# Patient Record
Sex: Male | Born: 1940 | Race: White | Hispanic: No | Marital: Married
Health system: Southern US, Community
[De-identification: ages and names within clinical notes are randomized; demographics above are authoritative.]

---

## 2003-11-10 ENCOUNTER — Inpatient Hospital Stay: Payer: Self-pay | Admitting: Cardiology

## 2003-11-10 ENCOUNTER — Other Ambulatory Visit: Payer: Self-pay

## 2003-11-11 ENCOUNTER — Other Ambulatory Visit: Payer: Self-pay

## 2003-11-13 ENCOUNTER — Ambulatory Visit: Payer: Self-pay | Admitting: Cardiology

## 2004-03-30 ENCOUNTER — Ambulatory Visit: Payer: Self-pay

## 2006-04-28 ENCOUNTER — Other Ambulatory Visit: Payer: Self-pay

## 2006-04-28 ENCOUNTER — Emergency Department: Payer: Self-pay | Admitting: Emergency Medicine

## 2006-09-26 ENCOUNTER — Other Ambulatory Visit: Payer: Self-pay

## 2006-09-26 ENCOUNTER — Inpatient Hospital Stay: Payer: Self-pay | Admitting: Internal Medicine

## 2007-07-02 ENCOUNTER — Emergency Department: Payer: Self-pay | Admitting: Emergency Medicine

## 2007-07-03 ENCOUNTER — Emergency Department: Payer: Self-pay | Admitting: Emergency Medicine

## 2008-02-29 ENCOUNTER — Ambulatory Visit: Payer: Self-pay | Admitting: Unknown Physician Specialty

## 2008-03-17 ENCOUNTER — Ambulatory Visit: Payer: Self-pay | Admitting: Unknown Physician Specialty

## 2008-10-15 ENCOUNTER — Ambulatory Visit: Payer: Self-pay | Admitting: Pain Medicine

## 2008-10-28 ENCOUNTER — Ambulatory Visit: Payer: Self-pay | Admitting: Pain Medicine

## 2008-11-11 ENCOUNTER — Ambulatory Visit: Payer: Self-pay | Admitting: Physician Assistant

## 2008-11-19 ENCOUNTER — Ambulatory Visit: Payer: Self-pay | Admitting: Unknown Physician Specialty

## 2008-11-21 ENCOUNTER — Ambulatory Visit: Payer: Self-pay | Admitting: Unknown Physician Specialty

## 2008-11-25 ENCOUNTER — Ambulatory Visit: Payer: Self-pay | Admitting: Unknown Physician Specialty

## 2009-09-01 ENCOUNTER — Ambulatory Visit: Payer: Self-pay | Admitting: Cardiology

## 2009-10-07 ENCOUNTER — Ambulatory Visit: Payer: Self-pay | Admitting: Otolaryngology

## 2010-10-27 ENCOUNTER — Ambulatory Visit: Payer: Self-pay | Admitting: Otolaryngology

## 2010-11-01 ENCOUNTER — Ambulatory Visit: Payer: Self-pay | Admitting: Otolaryngology

## 2010-11-12 LAB — PATHOLOGY REPORT

## 2010-11-15 ENCOUNTER — Ambulatory Visit: Payer: Self-pay | Admitting: Internal Medicine

## 2010-12-02 ENCOUNTER — Ambulatory Visit: Payer: Self-pay | Admitting: Internal Medicine

## 2010-12-09 ENCOUNTER — Ambulatory Visit: Payer: Self-pay | Admitting: Internal Medicine

## 2011-01-08 ENCOUNTER — Ambulatory Visit: Payer: Self-pay | Admitting: Internal Medicine

## 2011-02-08 ENCOUNTER — Ambulatory Visit: Payer: Self-pay | Admitting: Internal Medicine

## 2011-02-11 LAB — COMPREHENSIVE METABOLIC PANEL
Albumin: 3.7 g/dL (ref 3.4–5.0)
Alkaline Phosphatase: 159 U/L — ABNORMAL HIGH (ref 50–136)
Bilirubin,Total: 0.7 mg/dL (ref 0.2–1.0)
Calcium, Total: 9.3 mg/dL (ref 8.5–10.1)
EGFR (Non-African Amer.): >60
SGOT(AST): 31 U/L (ref 15–37)
SGPT (ALT): 29 U/L

## 2011-02-11 LAB — CBC CANCER CENTER
Basophil %: 0.3 %
Eosinophil #: 0.2 x10 3/mm (ref 0.0–0.7)
HCT: 35.8 % — ABNORMAL LOW (ref 40.0–52.0)
HGB: 12.4 g/dL — ABNORMAL LOW (ref 13.0–18.0)
Lymphocyte %: 9 %
MCH: 32.3 pg (ref 26.0–34.0)
MCHC: 34.6 g/dL (ref 32.0–36.0)
Monocyte #: 0.6 x10 3/mm (ref 0.0–0.7)
Monocyte %: 10.7 %
Neutrophil #: 4.3 x10 3/mm (ref 1.4–6.5)
RDW: 15 % — ABNORMAL HIGH (ref 11.5–14.5)

## 2011-02-11 LAB — LACTATE DEHYDROGENASE: LDH: 252 U/L — ABNORMAL HIGH (ref 87–241)

## 2011-03-11 ENCOUNTER — Ambulatory Visit: Payer: Self-pay | Admitting: Internal Medicine

## 2011-04-13 ENCOUNTER — Ambulatory Visit: Payer: Self-pay | Admitting: Specialist

## 2011-05-20 ENCOUNTER — Ambulatory Visit: Payer: Self-pay | Admitting: Internal Medicine

## 2011-05-20 LAB — CBC CANCER CENTER
Basophil #: 0 x10 3/mm (ref 0.0–0.1)
Basophil %: 0.5 %
Eosinophil #: 0.1 x10 3/mm (ref 0.0–0.7)
Eosinophil %: 1.2 %
HCT: 37.6 % — ABNORMAL LOW (ref 40.0–52.0)
HGB: 12.8 g/dL — ABNORMAL LOW (ref 13.0–18.0)
Lymphocyte #: 1.3 x10 3/mm (ref 1.0–3.6)
Lymphocyte %: 23.1 %
MCH: 32.4 pg (ref 26.0–34.0)
MCHC: 34.1 g/dL (ref 32.0–36.0)
MCV: 95 fL (ref 80–100)
Monocyte #: 0.5 x10 3/mm (ref 0.2–1.0)
Neutrophil %: 65.9 %
Platelet: 178 x10 3/mm (ref 150–440)
RBC: 3.97 10*6/uL — ABNORMAL LOW (ref 4.40–5.90)
WBC: 5.4 x10 3/mm (ref 3.8–10.6)

## 2011-05-20 LAB — HEPATIC FUNCTION PANEL A (ARMC)
Alkaline Phosphatase: 82 U/L (ref 50–136)
Bilirubin, Direct: 0.2 mg/dL (ref 0.00–0.20)
SGOT(AST): 14 U/L — ABNORMAL LOW (ref 15–37)
SGPT (ALT): 20 U/L
Total Protein: 7.2 g/dL (ref 6.4–8.2)

## 2011-05-20 LAB — LACTATE DEHYDROGENASE: LDH: 214 U/L (ref 87–241)

## 2011-05-20 LAB — CREATININE, SERUM: Creatinine: 0.87 mg/dL (ref 0.60–1.30)

## 2011-06-08 ENCOUNTER — Ambulatory Visit: Payer: Self-pay | Admitting: Internal Medicine

## 2011-07-05 ENCOUNTER — Inpatient Hospital Stay: Payer: Self-pay | Admitting: Internal Medicine

## 2011-07-05 LAB — COMPREHENSIVE METABOLIC PANEL
Alkaline Phosphatase: 75 U/L (ref 50–136)
Anion Gap: 11 (ref 7–16)
Bilirubin,Total: 1.3 mg/dL — ABNORMAL HIGH (ref 0.2–1.0)
Calcium, Total: 8.2 mg/dL — ABNORMAL LOW (ref 8.5–10.1)
Chloride: 99 mmol/L (ref 98–107)
EGFR (African American): >60
EGFR (Non-African Amer.): >60
Glucose: 126 mg/dL — ABNORMAL HIGH (ref 65–99)
SGOT(AST): 23 U/L (ref 15–37)
SGPT (ALT): 10 U/L — ABNORMAL LOW
Total Protein: 6.5 g/dL (ref 6.4–8.2)

## 2011-07-05 LAB — URINALYSIS, COMPLETE
Bacteria: NONE SEEN
Bilirubin,UR: NEGATIVE
Blood: NEGATIVE
Glucose,UR: NEGATIVE mg/dL (ref 0–75)
Protein: 30
RBC,UR: 1 /HPF (ref 0–5)
Squamous Epithelial: NONE SEEN
WBC UR: 1 /HPF (ref 0–5)

## 2011-07-05 LAB — CBC
MCHC: 35.8 g/dL (ref 32.0–36.0)
MCV: 93 fL (ref 80–100)
Platelet: 154 10*3/uL (ref 150–440)
RBC: 3.12 10*6/uL — ABNORMAL LOW (ref 4.40–5.90)
RDW: 13.8 % (ref 11.5–14.5)
WBC: 7.7 10*3/uL (ref 3.8–10.6)

## 2011-07-05 LAB — TROPONIN I
Troponin-I: <0.02 ng/mL
Troponin-I: <0.02 ng/mL

## 2011-07-05 LAB — CK TOTAL AND CKMB (NOT AT ARMC)
CK, Total: 20 U/L — ABNORMAL LOW (ref 35–232)
CK, Total: 21 U/L — ABNORMAL LOW (ref 35–232)
CK-MB: <0.5 ng/mL — ABNORMAL LOW (ref 0.5–3.6)

## 2011-07-05 LAB — PRO B NATRIURETIC PEPTIDE: B-Type Natriuretic Peptide: 837 pg/mL — ABNORMAL HIGH (ref 0–125)

## 2011-07-06 LAB — CBC WITH DIFFERENTIAL/PLATELET
Basophil #: 0 10*3/uL (ref 0.0–0.1)
Eosinophil #: 0 10*3/uL (ref 0.0–0.7)
Eosinophil %: 0 %
HGB: 10.4 g/dL — ABNORMAL LOW (ref 13.0–18.0)
Lymphocyte %: 8.1 %
MCH: 32.9 pg (ref 26.0–34.0)
MCHC: 35.1 g/dL (ref 32.0–36.0)
Monocyte %: 3.1 %
Neutrophil #: 5.1 10*3/uL (ref 1.4–6.5)
Neutrophil %: 88.6 %
RBC: 3.17 10*6/uL — ABNORMAL LOW (ref 4.40–5.90)
RDW: 13.7 % (ref 11.5–14.5)
WBC: 5.8 10*3/uL (ref 3.8–10.6)

## 2011-07-06 LAB — BASIC METABOLIC PANEL
Anion Gap: 9 (ref 7–16)
BUN: 11 mg/dL (ref 7–18)
Calcium, Total: 8.9 mg/dL (ref 8.5–10.1)
Co2: 26 mmol/L (ref 21–32)
EGFR (African American): >60
EGFR (Non-African Amer.): >60
Osmolality: 277 (ref 275–301)
Potassium: 3.9 mmol/L (ref 3.5–5.1)

## 2011-07-06 LAB — IRON AND TIBC
Iron Bind.Cap.(Total): 191 ug/dL — ABNORMAL LOW (ref 250–450)
Iron: 27 ug/dL — ABNORMAL LOW (ref 65–175)
Unbound Iron-Bind.Cap.: 164 ug/dL

## 2011-07-06 LAB — TROPONIN I: Troponin-I: <0.02 ng/mL

## 2011-07-06 LAB — PRO B NATRIURETIC PEPTIDE: B-Type Natriuretic Peptide: 1040 pg/mL — ABNORMAL HIGH (ref 0–125)

## 2011-07-07 LAB — CBC WITH DIFFERENTIAL/PLATELET
Basophil #: 0 10*3/uL (ref 0.0–0.1)
Eosinophil #: 0 10*3/uL (ref 0.0–0.7)
Eosinophil %: 0 %
HGB: 10 g/dL — ABNORMAL LOW (ref 13.0–18.0)
MCH: 31.9 pg (ref 26.0–34.0)
MCHC: 34.3 g/dL (ref 32.0–36.0)
MCV: 93 fL (ref 80–100)
Monocyte #: 0.6 x10 3/mm (ref 0.2–1.0)
Neutrophil #: 10.3 10*3/uL — ABNORMAL HIGH (ref 1.4–6.5)
Platelet: 213 10*3/uL (ref 150–440)
RBC: 3.13 10*6/uL — ABNORMAL LOW (ref 4.40–5.90)
WBC: 11.4 10*3/uL — ABNORMAL HIGH (ref 3.8–10.6)

## 2011-07-07 LAB — BASIC METABOLIC PANEL
Anion Gap: 11 (ref 7–16)
BUN: 14 mg/dL (ref 7–18)
Chloride: 102 mmol/L (ref 98–107)
Co2: 25 mmol/L (ref 21–32)
Creatinine: 0.6 mg/dL (ref 0.60–1.30)
EGFR (African American): >60
Osmolality: 280 (ref 275–301)

## 2011-07-09 ENCOUNTER — Ambulatory Visit: Payer: Self-pay | Admitting: Internal Medicine

## 2011-08-08 DEATH — deceased

## 2013-12-14 IMAGING — RF DG CHEST FLUORO
1 series · 11 of 11 positions shown · non-contrast
Comparison: none

REASON FOR EXAM: SNIFF   RT elevated diaphragm
COMMENTS:

PROCEDURE:     FL  - FL CHEST FLUORO W/ SPOT FILMS  - April 13, 2011  [DATE]
RESULT:

[Series 1: run · 6 acquisitions, 11 frames shown]
[im 1/6]
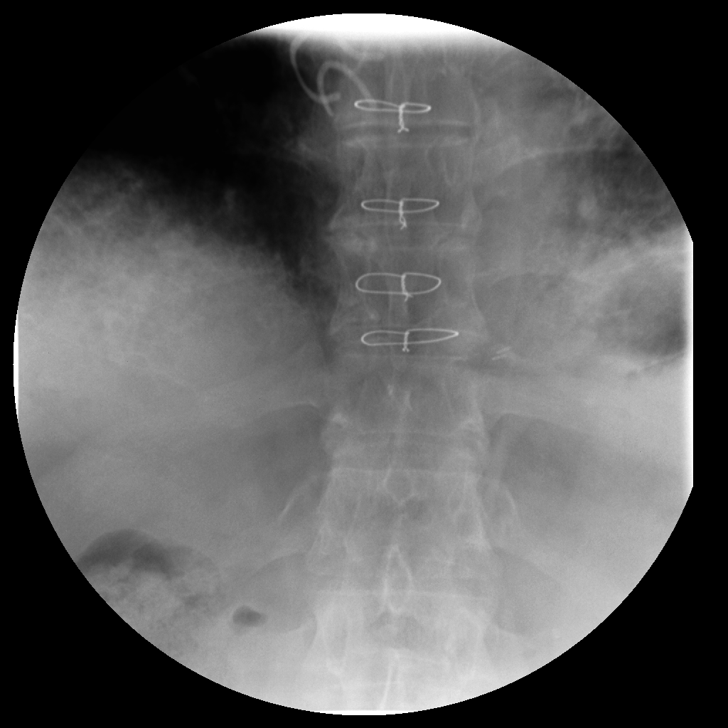
[im 2/6]
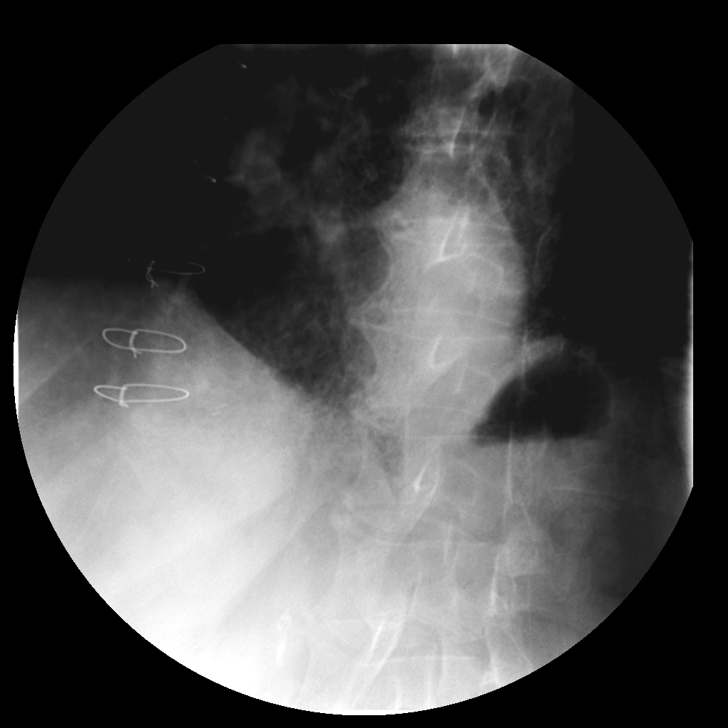
[im 3/6]
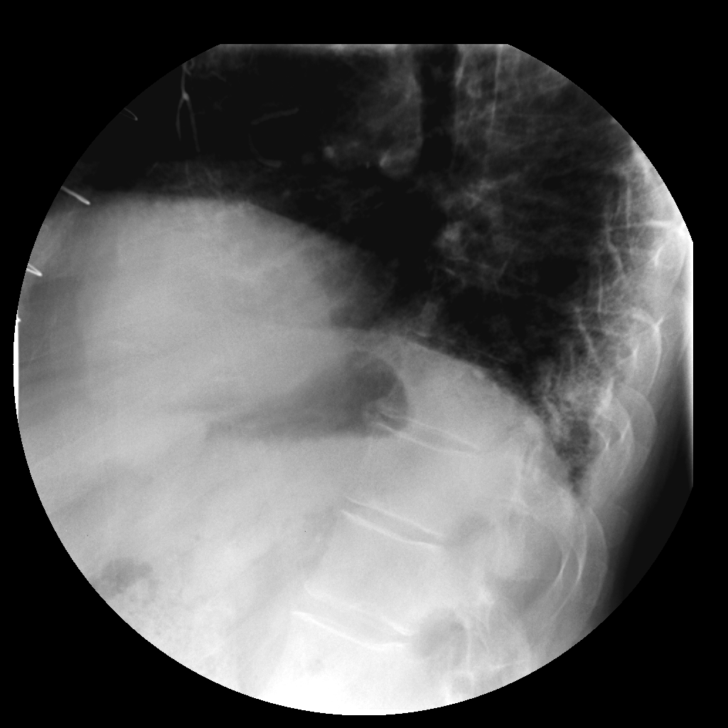
[im 4/6]
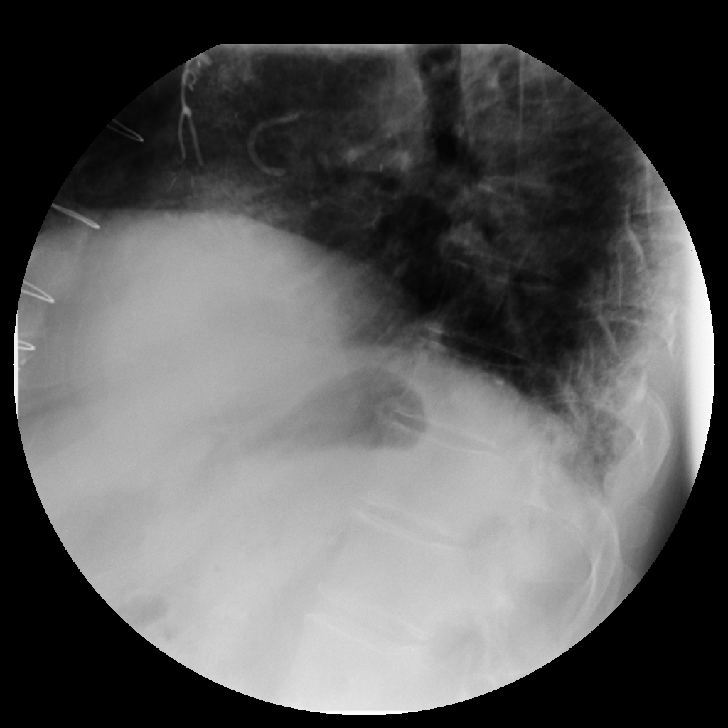
[im 4/6]
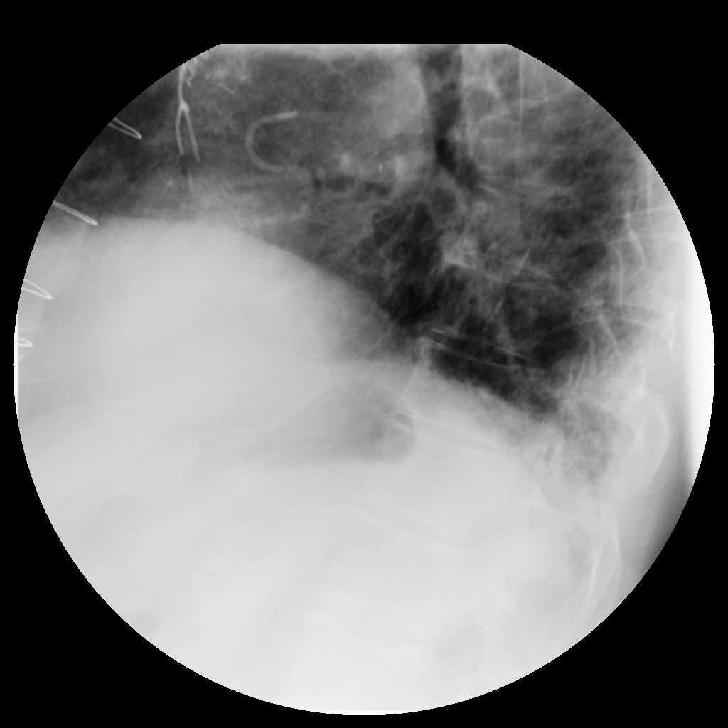
[im 4/6]
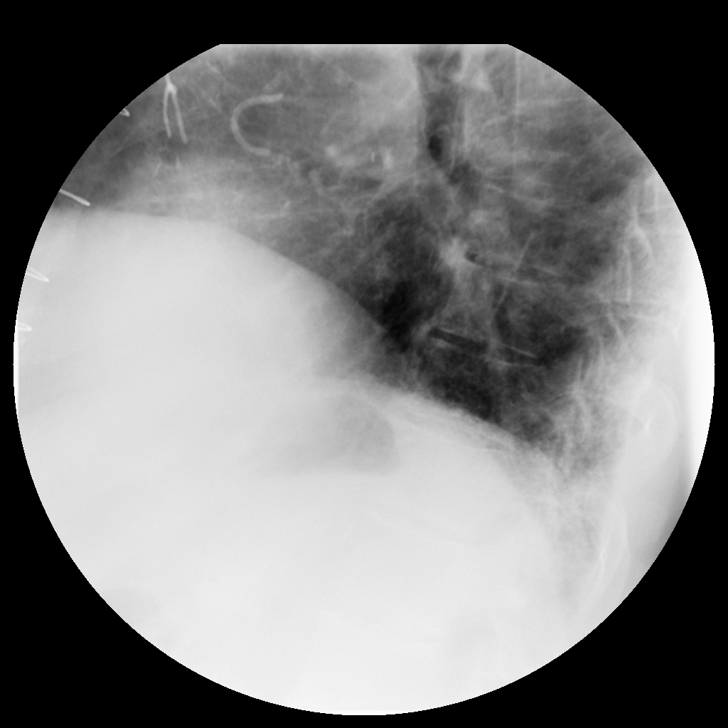
[im 4/6]
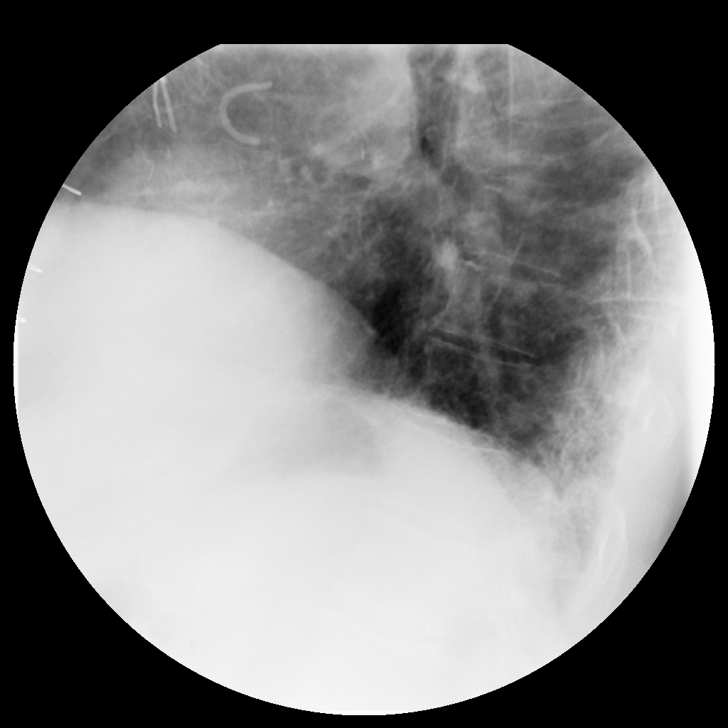
[im 5/6]
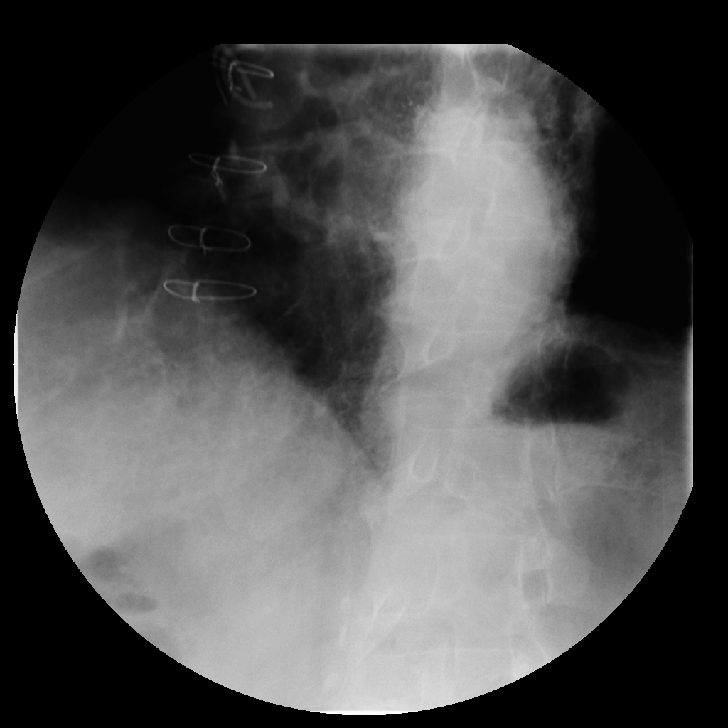
[im 5/6]
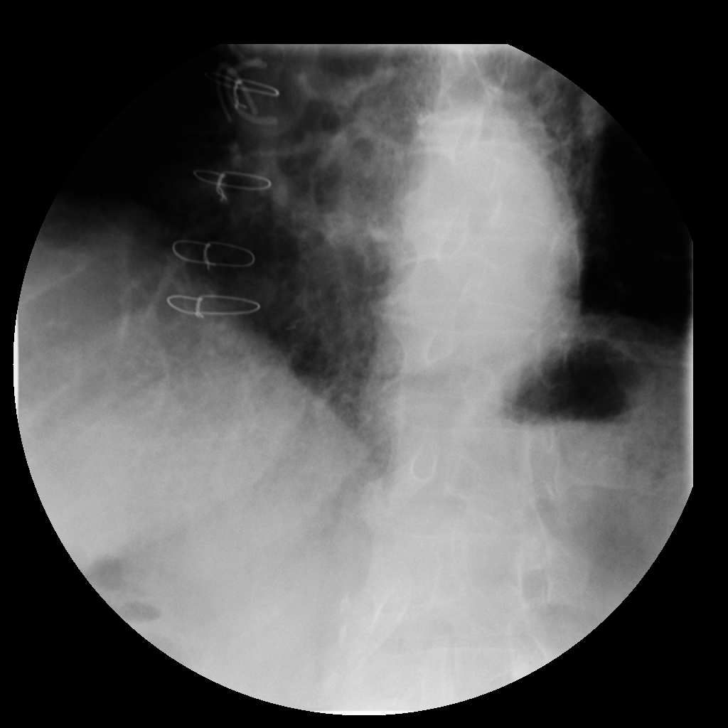
[im 6/6]
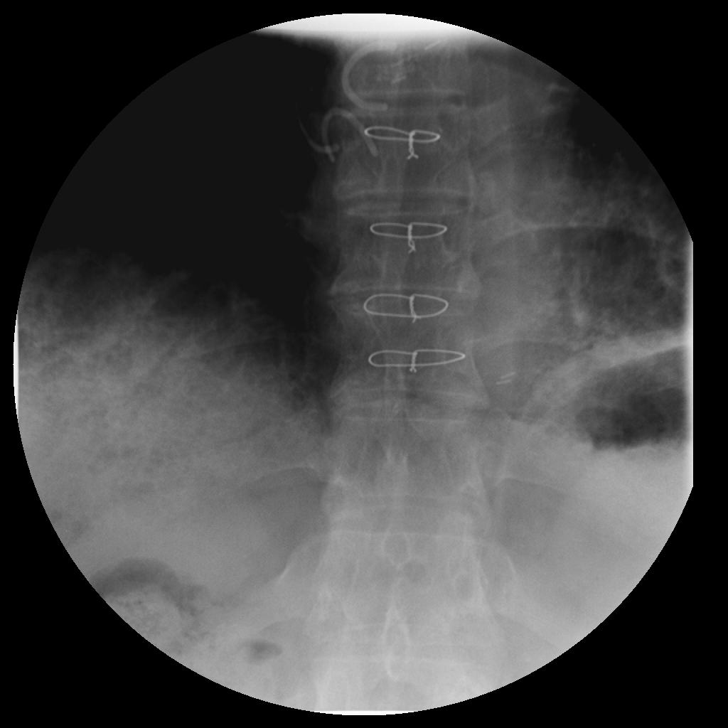
[im 6/6]
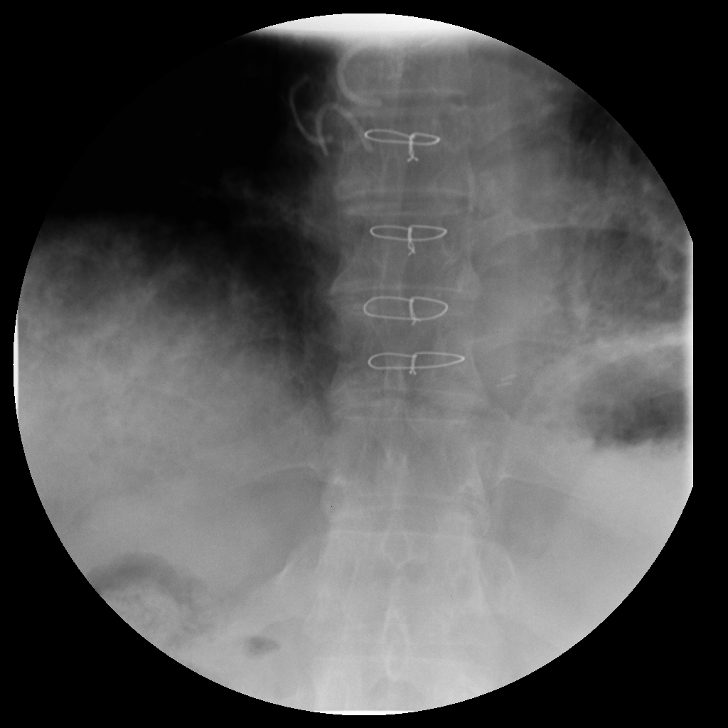

[11 of 11 positions shown; findings below may reference images not displayed]

FINDINGS: Fluoroscopic evaluation of the right and left hemidiaphragms were
evaluated during sniffing. There is mild excursion of the elevated right
hemidiaphragm without evidence of paradoxical movement. The left
hemidiaphragm demonstrates appropriate excursion during sniffing.
IMPRESSION: 1. Elevated right hemidiaphragm without evidence of paralysis. There is
appropriate excursion though decreased when compared to the left.

## 2014-01-26 IMAGING — CT CT NECK-CHEST-ABD-PELV W/ CM
1 of 4 series · 4 of 14 positions shown, 5 images · non-contrast
Comparison: none

REASON FOR EXAM: Restaging Lymphoma Allergic to IV Dye Has Prep
COMMENTS:

PROCEDURE:     CT  - CT NECK CHEST ABD/PEL W  - May 26, 2011  [DATE]
RESULT:
TECHNIQUE: CT of the neck, chest, abdomen and pelvis is reconstructed at
mm slice thickness in the axial plane.
Comparison is made to the previous exam dated 19 November, 2010.

[Series 4: soft tissue · axial · 0.82mm/px · z∈[-378,+27]mm · 4 of 136 slices shown, 5 images]
[im 28/136  soft-tissue]
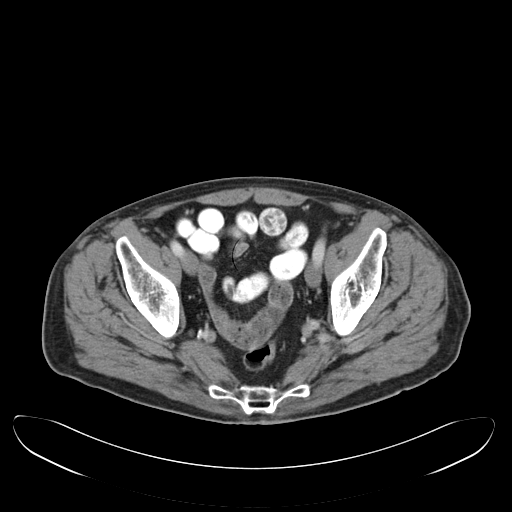
[im 28/136  bone]
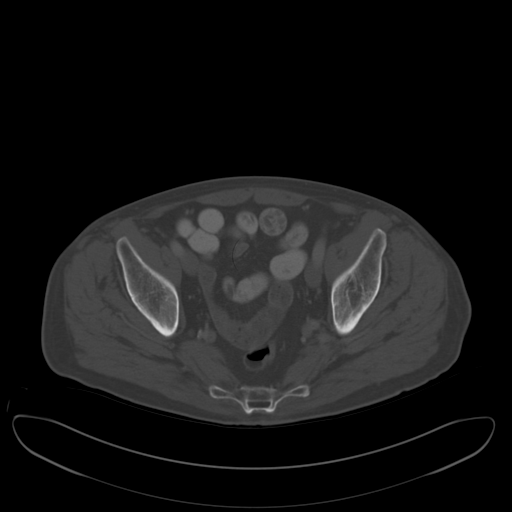
[im 55/136  soft-tissue]
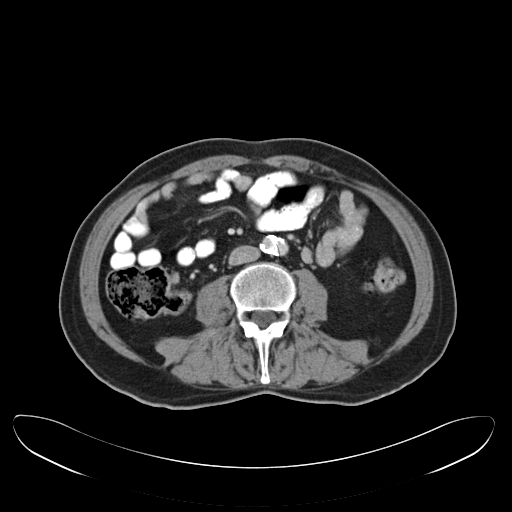
[im 82/136  soft-tissue]
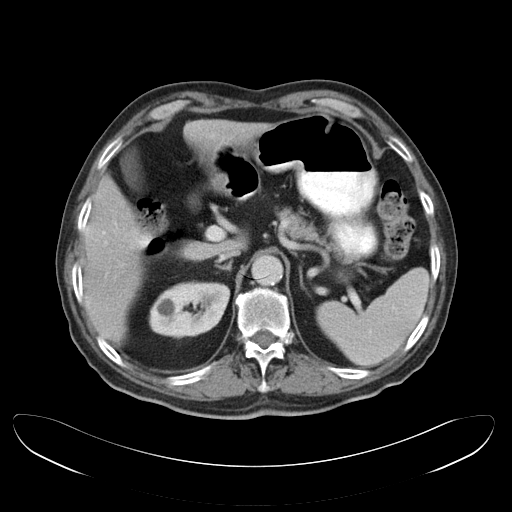
[im 109/136  soft-tissue]
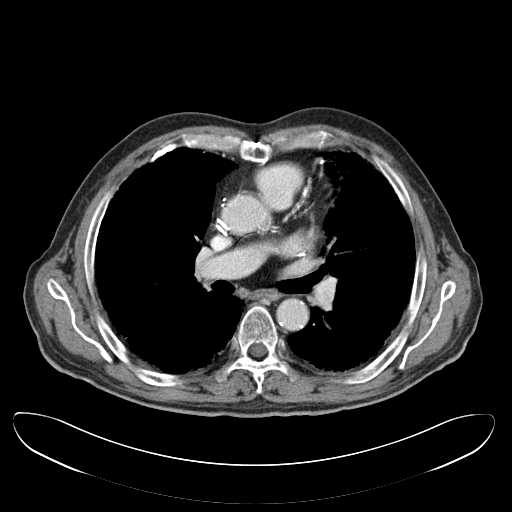

[4 of 14 positions shown; findings below may reference images not displayed]

FINDINGS: There is some artifact on many of the images slightly degrading
the study. The base of the brain is unremarkable. The sinuses and mastoid
air cells show normal aeration. The salivary glands are unremarkable. No
cervical mass or adenopathy is evident. The thyroid lobes enhance normally.
The patient has some residual small AP window, para-aortic and precarinal
lymph nodes. Short axis measurement of the precarinal node is 6.8 mm on
image #22. Small, subcarinal lymph node is present. No hilar mass or
adenopathy is evident. The heart is enlarged. There are calcified pleural
plaques as noted previously consistent with previous asbestos exposure and
asbestos related pleural disease. Pulmonary fibrotic changes with septal
thickening are present. Underlying asbestosis is a strong likelihood.
Pulmonology follow-up is recommended. No new or evolving mass is apparent
within the lungs.

The liver is normal in size and enhancement. The spleen is not enlarged. No
retroperitoneal mass or adenopathy is evident. No pelvic or inguinal
adenopathy is seen. No mesenteric adenopathy is present. No abnormal bowel
distention or bowel wall thickening is demonstrated. Minimal density is seen
in the gallbladder in the dependent region on image #52 suggestive of
cholelithiasis. The adrenal glands are normal. There is a cyst in the upper
pole of the right kidney measuring up to 1.9 cm diameter. No solid renal
mass or renal enlargement is evident. Atherosclerotic calcification is
present within the abdominal aorta extending into the iliac systems. There
is no aneurysm. The pancreas appears unremarkable. The urinary bladder is
nondistended. The wall is mildly prominent which could reflect under
distention, neurogenic bladder or cystitis.
IMPRESSION: 1.  Asbestos related pleural disease and probable asbestosis.
2.  Decreased size of the mediastinal lymph nodes. No areas of adenopathy
are present by size criteria. Follow-up PET may be beneficial if there is
concern for persistent or recurrent disease.
3.  Atherosclerotic disease.
4.  CABG changes are present.
5.  Renal cyst.
6.  Possible cholelithiasis.
7.  The bladder is nondistended.

## 2014-06-01 NOTE — H&P (Signed)
PATIENT NAME:  Ivan Mason, Ivan Mason MR#:  [id number] DATE OF BIRTH:  November 27, 1940  DATE OF ADMISSION:  07/05/2011  PRIMARY CARE PHYSICIAN:  Dr. Candelaria Stagers. CARDIOLOGIST:  Dr. Harold Hedge. PULMONOLOGIST: Dr. Meredeth Ide.   CHIEF COMPLAINT: Chest pain and shortness of breath.   HISTORY OF PRESENT ILLNESS: This is a 74 year old male who presents to the Emergency Room due to acute onset of chest pain and shortness of breath that began earlier last night. The patient says that he woke up around 4:00 in the morning when he was having chest pain in the center of his chest. He went to the bathroom and then felt a bit dizzy and lightheaded and he sat down and his dizziness went away. He took some sublingual nitroglycerin, but the chest pain did not go away and then when he tried to get up again, he became even more dizzy and became more short of breath. EMS was called and he was brought to the hospital. Upon arrival of EMS, he was noted to be significantly hypoxic with 02 sats in the mid 70s. Presently, he is on a nonrebreather and using some accessory muscles. Due to his profound chest pain and worsening hypoxia hospitalist services were contacted for further treatment and evaluation. The patient does admit to a cough, but it is nonproductive. It has been going on for many months. He denies any nausea, vomiting, abdominal pain, constipation, diarrhea, any weight loss, hemoptysis, or any other associated symptoms.   REVIEW OF SYSTEMS: CONSTITUTIONAL: No documented fever, weight loss about 20 to 30 pounds since September of last year since he was diagnosed with lymphoma. EYES: No blurred or double vision. ENT: No tinnitus or postnasal drip. No redness of the oropharynx. RESPIRATORY: Positive cough, chronic. No wheeze, no hemoptysis. Positive dyspnea. CARDIOVASCULAR: Positive chest pain, no orthopnea, no palpitations, or syncope. GASTROINTESTINAL: No nausea. No vomiting. No diarrhea. No abdominal pain, no melena, no  hematochezia. GU: No dysuria or hematuria. ENDOCRINE: No polyuria or nocturia. No heat or cold intolerance. HEMATOLOGIC: No anemia, no bruising, no bleeding. INTEGUMENTARY: No rashes. No lesions. MUSCULOSKELETAL: No arthritis, no swelling or gout. NEUROLOGIC: No numbness, no tingling, no ataxia, no seizure-type activity. PSYCH: No anxiety, no insomnia, no ADD.   PAST MEDICAL HISTORY:  1. Coronary artery disease, status post coronary artery bypass graft and stent placement. 2. History of B cell lymphoma, currently in remission.  3. 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  4. Hypertension.  5. Hyperlipidemia.  6. Anxiety.   ALLERGIES: IV dye to which he gets hives.   SOCIAL HISTORY: No smoking, occasional alcohol use. No illicit drug abuse. Lives at home with his wife.   FAMILY HISTORY: The patient's mother died from complications of myocardial infarction and stroke. Father died from multiple transient ischemic attacks, but he does not know the acute source of his death.   CURRENT MEDICATIONS:  1. Metoprolol tartrate 50 mg b.i.d.  2. Ramipril 2.5 mg daily.  3. Amlodipine 5 mg daily.  4. Atorvastatin 40 mg daily.  5. Gemfibrozil 600 mg b.i.d.  6. Xanax 0.25 mg b.i.d. as needed.  7. Ranexa 1000 mg b.i.d.  8. Nitroglycerin patch daily. 9. Plavix 75 mg daily.  10. Sublingual nitroglycerin as needed. 11. Aleve as needed.  12. Aspirin 81 mg daily.  13. Vitamin E 800 international units daily.  14. Vitamin C 1000 mg daily. 15. Prevacid 15 mg daily. 16. Gentamicin ointment apply to the nose three times daily. 17. Hydrocodone with chlorpheniramine suspension  1 tsp q.24 hours.  18. Pilocarpine 5 mg daily.   PHYSICAL EXAMINATION ON ADMISSION:  VITAL SIGNS: Temperature 98.5, pulse 72, respirations 20, blood pressure 115/60, sats 98% on nonrebreather.   GENERAL: He is a pleasant appearing male in mild respiratory distress.   HEENT: He is atraumatic, normocephalic.  Extraocular muscles are intact. Pupils are equal and reactive to light. Sclerae are anicteric. No conjunctival injection. No pharyngeal erythema.   NECK: Supple. No jugular venous distention, no bruits, no lymphadenopathy, no thyromegaly.   HEART: Regular rate and rhythm. No murmurs, rubs, or clicks.   LUNGS: He has diffuse crackles bilaterally. Positive use of accessory muscles. No dullness to percussion.   ABDOMEN: Soft, flat, nontender, nondistended. Has good bowel sounds. No hepatosplenomegaly appreciated.   EXTREMITIES: No evidence of any cyanosis, clubbing, or peripheral edema. Has +2 pedal and radial pulses bilaterally.   NEUROLOGICAL: The patient is alert, awake, and oriented x3 with no focal motor or sensory deficits appreciated bilaterally.   SKIN: Moist and warm with no rash appreciated.   LYMPHATIC: There is no cervical or axillary lymphadenopathy.   LABORATORY, DIAGNOSTIC, AND RADIOLOGICAL DATA: Glucose 126, BUN 13, creatinine 0.6, sodium 134, potassium 3.8, chloride 99, bicarbonate 24. The patient's LFTs are within normal limits. Troponin less than 0.02. White cell count 7.7, hemoglobin 10.4, hematocrit 29.1, platelet count 154. D-dimer is elevated at 3.35. Urinalysis normal. The patient did have an EKG done which showed normal sinus rhythm with normal axis and no ST or T wave changes. Chest x-ray shows findings representing superimposed pulmonary edema on the background of chronic interstitial lung disease. Worsening chronic interstitial lung disease is also a differential consideration.   ASSESSMENT AND PLAN: This is a 74 year old male with history of pulmonary fibrosis secondary to asbestosis exposure, history of lymphoma, hypertension, coronary artery disease, status post coronary artery bypass graft and stent placement, gastroesophageal reflux disease, and hyperlipidemia who presents to the hospital with acute onset of chest pain and shortness of breath.  1. Chest pain. The  patient has multiple risk factors for both pulmonary embolism and underlying coronary artery disease. He has a previous history of coronary artery bypass graft and stent placement. He also has a previous history of malignancy, now with an elevated d-dimer. For now, will place him on telemetry and check serial cardiac enzymes. Continue his aspirin, Plavix, beta blocker, statin and Ranexa and also nitroglycerin. Will get a cardiology consult. The patient likely would also benefit from a CT of the chest to rule out pulmonary embolism. As his d-dimer is elevated, but he is allergic to IV dye, I will premedicate him and try to get a CT chest. Will empirically start him on Lovenox to treat both pulmonary embolus and acute coronary syndrome presently.  2. Shortness of breath/acute respiratory failure. This is likely acute on chronic respiratory failure. The patient has underlying pulmonary fibrosis, but his oxygen requirements have gone up. Therefore, there is a concern for possible underlying pulmonary embolism. As mentioned, I will empirically start him on Lovenox. I will get a CT of the chest with contrast but he needs to be premedicated given his allergy to IV dye. Continue oxygen supplementation and follow ABGs. Will get a pulmonary consult. The patient is followed by Dr. Meredeth Ide.  3. History of coronary artery disease, status post coronary artery bypass graft. The patient is currently having chest pain. His first set of cardiac markers are negative. I will place him on telemetry and follow serial markers. Continue aspirin,  Plavix, beta blocker, statin and Ranexa. Will get a cardiology consult. The patient is known to Dr. Lady GaryFath.  4. Gastroesophageal reflux disease. Continue with omeprazole.  5. Hyperlipidemia. Continue with atorvastatin and gemfibrozil.  6. History of lymphoma. The patient is followed by Dr. Sherrlyn HockPandit, but the cancer is currently in remission. He had a recent CT of chest, abdomen and pelvis with  contrast in April of this past year which showed no evidence of any lymphadenopathy.  7. Hypertension, presently hemodynamically stable. Continue metoprolol, Norvasc and Ramipril.  8. Anxiety. Continue with p.r.n. Xanax.   CODE STATUS: The patient is a FULL CODE.   The patient will be transferred to Dr. Fransico Settershaplin's service by tomorrow.       Critical Care TIME SPENT WITH ADMISSION: 50 minutes.  ____________________________ Rolly PancakeVivek J. Cherlynn KaiserSainani, MD vjs:ap D: 07/05/2011 09:01:18 ET T: 07/05/2011 09:42:14 ET JOB#: [id number]  cc: Rolly PancakeVivek J. Cherlynn KaiserSainani, MD, <Dictator> Jimmie Mollyon C. Candelaria Stagershaplin, MD Houston SirenVIVEK J SAINANI MD ELECTRONICALLY SIGNED 07/08/2011 16:16

## 2014-06-01 NOTE — Consult Note (Signed)
PATIENT NAME:  Ivan Mason, Ivan Mason MR#:  [id number] DATE OF BIRTH:  1940/02/21  DATE OF CONSULTATION:  07/05/2011  REFERRING PHYSICIAN:   CONSULTING PHYSICIAN:  Ivan E. Meredeth Ide, MD  PRIMARY CARE PHYSICIAN: Dr. Conchita Mason    CHIEF COMPLAINT: Shortness of breath, hypoxia, pulmonary fibrosis.   HISTORY OF PRESENT ILLNESS: [name] is a 74 year old gentleman whom I know quite well from [facility] who was also being followed by Dr. Candelaria Mason as well as Dr. Sherrlyn Mason the latter of which he is being followed for lymphoma, status post chemotherapy and radiation. He presented to pulmonary because of increasing shortness of breath and subsequently we thought it was due primarily to pulmonary fibrosis, which was there prior to radiation and we thought radiation may have added some more insult. He also had an elevated right diaphragm, which thought could be contributing if it was paralyzed hence went on to a sniff test which showed that the diaphragm was not paralyzed. His chest CT prior to this one has been showing significant resolution of his lymphadenopathy or his lymphoma and after being placed on prednisone as well as albuterol and Dulera his breathing had improved; in fact when he was here on 06/22/2011 after resting exercise oxygen sat at peak exercise his sats remained greater than 97% on room air.   Unfortunately since last seeing [place] and attempting to decrease his prednisone Ivan Mason voiced that he was noticing that he was becoming more and more short of breath, also noted that his exercise tolerance was decreasing. At times when he got up his sats would drop down in the low 70s, would get dizzy at times but did not pass out. This progressed to the point that he could not take it, hence called EMS to come and see him. At time when he came he was noted to be hypoxic and hence was brought promptly to the Emergency Room and consequently was admitted. He also mentioned that off and on he thought he  was having bouts of fever, slight cough. There was no associated nausea, vomiting nor was he having any frank chest pain, palpitations, syncope. No gross hematuria. No lymph nodes. No bleeding. No sinus pressure or significant postnasal dripping. No dysphagia or choking spells. At the time that I saw him he was on a nonrebreather with sats 92% to 93%. Wife was not at that time in the room. Discussed the case with Ivan Mason and we thought he would be better served in the Intensive Care Unit presently still wanting everything done including intubation. Chest CT scan was ordered intent to rule out PE.   PAST MEDICAL HISTORY:  1. Coronary artery disease status post open heart surgery as well as stent placement.  2. History of B-cell lymphoma followed by Dr. Sherrlyn Mason.  3. History of pulmonary fibrosis, questionable due to asbestos exposure versus exposure to formaldehyde.  4. Hypertension.  5. Hyperlipidemia.  6. Anxiety.  7. Gastroesophageal reflux disease.    PAST SURGICAL HISTORY:  1. Status post coronary artery bypass surgery. 2. Surgery for Dupuytren's contracture of the hands. 3. Repeat surgery to the hand in 2007. 4. Cardiac stent placement in 2008.  5. Status post right cervical node biopsy and resection. Positive for B-cell lymphoma.   ALLERGIES: IV dye, gets hives.  ADMITTING MEDICATIONS:   1. Metoprolol 50 mg b.i.d.  2. Ramipril 2.5 mg daily.  3. Norvasc 5 mg daily.  4. Atorvastatin 40 mg daily.  5. Gemfibrozil 600 mg b.i.d.  6.  Xanax 0.25 mg b.i.d.  7. Ranexa 1000 mg b.i.d.  8. Nitroglycerin patch daily. 9. Plavix 75 mg daily.  10. Sublingual nitroglycerin p.r.n.  11. Aleve p.r.n.  12. Aspirin 81 mg daily.  13. Vitamin E 800 international units daily. 14. Vitamin C 1000 mg daily. 15. Prevacid 15 mg daily. 16. Gentamicin ointment to nose t.i.d.  17. Pilocarpine 5 mg daily.  18. Hydrocodone with chlorpheniramine suspension 1 tsp q.24 hours. SOCIAL HISTORY: Does not smoke.  Occasionally drink alcoholic beverages. Lives at home with his wife.   FAMILY HISTORY: Patient's mother died from complications of myocardial infarction and stroke. Father died from transient ischemic attack.   REVIEW OF SYSTEMS: Ivan Mason is in bed, able to speak in full sentences. Voices not having headache, dizziness, passing out spells, change in vision or hearing. No sore throat, sinus pressure, nosebleeds nor is he having any neck pain, stiffness or parotid gland enlargement. No pleurisy, hemoptysis. There is increased shortness of breath as already described with activity. No chest pain, palpitations, syncope nausea, vomiting, diarrhea, hematuria, dysuria, flank pain rashes, nonhealing ulcers, dysphagia, gastroesophageal reflux disease, choking spells, rashes, lymph nodes. No new focal neurological symptoms. Weak, however, dizzy at times when he stands up. No suicidal ideation, muscle tenderness, joint effusion. Rest of review was noncontributory.   PHYSICAL EXAMINATION:  VITAL SIGNS: Temperature 97.6, pulse 74, respirations 20, blood pressure 114/62, oxygen sats 99% on nonrebreather.   GENERAL: Pleasant gentleman, quite cooperative sitting in a chair with a nonrebreather on, lean, tall. Mild to moderate respiratory distress, however.  HEENT: Normocephalic, nontraumatic. EOMI, perrla Oropharynx no thrush.   NECK: Supple. No jugular venous distention. No thyromegaly or stridor.   CHEST: Bilateral breath sounds with basilar crackles noted. Much more than when we saw him last in the office.   CARDIAC: Regular rhythm. Mildly tachycardic. No new murmurs or gallops. No peripheral signs spontaneous bacterial endocarditis.   ABDOMEN: Soft, nontender.   EXTREMITIES: No significant edema, cyanosis, Homans signs.   SKIN: Fair turgor. No rashes.   LYMPH: No nodes in neck or supraclavicular area   NEUROLOGICAL: Did not ambulate but able to raise extremities against gravity. Cranial nerves noted  to be unchanged and intact.    PSYCH: Appropriate affect. Somewhat anxious as one would expect.  MUSCULOSKELETAL: No obvious joint effusions. Range of motion intact.   LABORATORY, DIAGNOSTIC AND RADIOLOGICAL DATA: Glucose 142, BUN 11, creatinine 0.55, sodium 138, potassium 3.9, chloride 103. Cardiac enzymes are flat. White count 7.7, hemoglobin 10.4, hematocrit 29.1, platelets 154. D-dimer 3.35. Urinalysis was no blood or pyuria. ABG on admission pH 7.45, pCO2 37, pO2 115 on FiO2 of 0.44 nasal cannula. Repeat gas on nonrebreather showed pH 7.41, pCO2 39, pO2 80, FiO2 1. Chest x-ray findings showed interstitial changes, question is it all pulmonary fibrosis or superimposed pulmonary edema.   IMPRESSION/RECOMMENDATIONS: Mr. Londell MohLeonard Stanger is a pleasant 74 year old gentleman who is known to have B-cell lymphoma followed by Dr. Sherrlyn HockPandit, known pulmonary fibrosis, plus/minus minimum radiation pneumonitis who comes in with worsening shortness of breath, significant hypoxia now on 100% nonrebreather with marginal oxygenation. Looking at his chest x-ray I agree that his pulmonary fibrosis has worsened versus superimposed pulmonary edema or some other inflammatory noninfectious cause.  However, a superimposed infection cannot be ruled out. Seems less likely since he has a normal white count on prednisone. Although he did give a vague history that he may have had some fevers. The other part of the differentials include the possibility of increased  lymphogenic spread from his B-cell lymphoma. Hence, I think we should notify Dr. Sherrlyn Mason about his admission. In the meantime, I agree with covering him with oxygen and sending him to the Intensive Care Unit to ensure that in case he does fail  we will move on with intubation. Will also consider palliative to see him to help clarify some of the issues going forward. Agree with steroids. Cautious diuresis would be reasonable but I would check a BNP, sed rate, ace level in the  morning with his routine labs. Lastly would empirically place him on Zithromax 500 mg daily and Rocephin 1 gram daily. Beta-2 agonist with albuterol q.6 hours via nebulizer treatments. Attempt to wean his oxygen as tolerated. Reassess in the morning. See orders.  ____________________________ Clenton Pare Meredeth Ide, MD hef:cms D: 07/06/2011 08:11:00 ET T: 07/06/2011 11:07:18 ET  JOB#: [id number] cc: Ivan E. Meredeth Ide, MD, <Dictator> Mertie Moores MD ELECTRONICALLY SIGNED 07/06/2011 12:49

## 2014-06-01 NOTE — H&P (Signed)
PATIENT NAME:  Ivan Mason, Ivan Mason MR#:  [id number] DATE OF BIRTH:  1940/09/10  DATE OF ADMISSION:  07/05/2011  PRIMARY CARE PHYSICIAN: Dr. Conchita Paris  CARDIOLOGIST: Dr. Harold Hedge  PULMONOLOGIST: Dr. Meredeth Ide  CHIEF COMPLAINT: Shortness of breath and chest pain.   HISTORY OF PRESENT ILLNESS: This is a 74 year old male who presents to the hospital with acute onset of chest pain and shortness of breath.   Patient says that he woke up around 4:00 this morning having significant chest pain in the center of his chest, about 8/10 in intensity. He woke up and went to the bathroom and became quite dizzy and diaphoretic. He then came and sat down and his dizziness improved. He then took a sublingual nitroglycerin but did not alleviate his chest pain. He therefore called EMS and he was brought to the hospital. In the Emergency Room he was noted to be hypoxic and also noted to have significant chest pain. Given his previous history of coronary artery disease with CABG and also history of underlying lymphoma hospitalist service was contacted for further treatment and evaluation. Patient denies any hemoptysis. He denies any paroxysmal nocturnal dyspnea, any abdominal pain, any constipation, diarrhea or any other associated symptoms presently.   REVIEW OF SYSTEMS: CONSTITUTIONAL: No documented fever. No weight gain but a 20 pound weight loss since September of last year since he was diagnosed with lymphoma. EYES: No blurry or double vision. ENT: No tinnitus. No postnasal drip. No redness of the oropharynx. RESPIRATORY: Positive cough. No wheeze. No hemoptysis. Positive dyspnea. CARDIOVASCULAR: Positive chest pain. No orthopnea, no palpitations, no syncope. GASTROINTESTINAL: No nausea, no vomiting, no diarrhea, no abdominal pain, no melena, no hematochezia. GENITOURINARY No dysuria. No hematuria. ENDOCRINE: No polyuria, nocturia. No heat or cold intolerance. HEME: No anemia, no bruising, no bleeding. INTEGUMENTARY: No  rashes. No lesions. MUSCULOSKELETAL: No arthritis. No swelling. No gout. NEUROLOGIC: No numbness, no tingling, no ataxia, no seizure-type activity. PSYCH: No anxiety, no insomnia, no ADD.   PAST MEDICAL HISTORY:  1. Hypertension.  2. Hyperlipidemia.  3. History of coronary artery disease, status post coronary artery bypass graft and stent placement. 4. 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  5. Anxiety.  6. 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   ALLERGIES: IV dye which causes hives.   SOCIAL HISTORY: No smoking. Occasional alcohol use. No illicit drug abuse. Lives at home with his wife.   FAMILY HISTORY: Mother died from complications of MI and cerebrovascular accident. Father died from multiple transient ischemic attacks but of unknown cause.   CURRENT MEDICATIONS:  1. Metoprolol tartrate 50 mg b.i.d.  2. Ramipril 2.5 mg daily.  3. Amlodipine 5 mg daily.  4. Atorvastatin 40 mg daily.  5. Gemfibrozil 600 mg b.i.d.  6. Xanax 0.25 mg daily as needed. 7. Ranexa 1000 mg b.i.d.  8. Nitroglycerin patch 0.6 mg daily.  9. Plavix 75 mg daily.  10. Sublingual nitroglycerin as needed. 11. Aleve as needed.  12. Aspirin 81 mg daily.  13. Vitamin E 800 international units daily. 14. Vitamin C 1000 mg daily. 15. Prevacid 24 hour 15 mg daily. 16. Pilocarpine 5 mg daily at bedtime.   PHYSICAL EXAMINATION:  VITAL SIGNS: Patient's vital signs are noted to be: Temperature 98.5, pulse 72, respirations 20, blood pressure 115/60, sats 98% on nonrebreather.   GENERAL: He is a pleasant appearing male in mild respiratory distress.   HEENT: He is atraumatic, normocephalic. His extraocular muscles are intact.  Pupils equal, reactive to light. Sclerae anicteric. No conjunctival injection. No pharyngeal erythema.   NECK: Supple. There is no jugular venous distention, no bruits, no lymphadenopathy, no thyromegaly.   HEART: Regular rate, rhythm.  No murmurs, no rubs, no clicks.   LUNGS: He has diffuse crackles bilaterally. They are dry crackles. Positive use of accessory muscles. No dullness to percussion.   ABDOMEN: Soft, flat, nontender, nondistended. Has good bowel sounds. No hepatosplenomegaly appreciated.   EXTREMITIES: No evidence of any cyanosis, clubbing, or peripheral edema. Has +2 pedal and radial pulses bilaterally.   NEUROLOGICAL: Patient is alert, awake, oriented x3 with no focal motor or sensory deficits appreciated bilaterally.   SKIN: Moist, warm with no rash appreciated.   LYMPHATIC: There is no cervical or axillary lymphadenopathy.   LABORATORY, DIAGNOSTIC, AND RADIOLOGICAL DATA: Serum glucose 126, BUN 13, creatinine 0.6, sodium 134, potassium 3.8, chloride 99, bicarbonate 24. Patient's LFTs are within normal limits. Troponin less than 0.02. White cell count 7.7, hemoglobin 10.4, hematocrit 29.1, platelet count 154, d-dimer is elevated at 3.3. Urinalysis within normal limits.   Patient did have a chest x-ray done which showed findings which may represent superimposed pulmonary edema on a background of chronic interstitial lung disease, worsening chronic interstitial lung disease is also a differential consideration. Patient also had an EKG done which showed normal sinus rhythm with normal axis and no evidence of any acute ST or T wave changes.   ASSESSMENT AND PLAN: This is a 74 year old male with history of pulmonary fibrosis, history of B-cell lymphoma, hypertension, history of coronary artery disease, status post coronary artery bypass graft and stent placement, gastroesophageal reflux disease, hyperlipidemia, anxiety presents to the hospital with acute onset of chest pain and worsening shortness of breath.  1. Chest pain. Patient has multiple risk factors for having both underlying pulmonary embolism and also coronary artery disease. He has a previous history of coronary artery disease with stent and coronary artery  bypass graft. He also now has elevated d-dimer with underlying malignancy. For now will place him on telemetry, follow serial cardiac markers. Continue his aspirin, Plavix, beta blockers, statin, Ranexa, nitroglycerin and oxygen. Will get a cardiology consultation, well known to Dr. Lady Gary. Patient would also benefit from getting a CT of his chest with contrast to rule out PE but he is allergic to IV dye so he needs to be premedicated, although I will empirically go ahead and start treating him with Lovenox which should treat both the PE and acute coronary syndrome presently.  2. Shortness of breath/acute respiratory failure. The exact etiology of this is currently unclear. Patient does have underlying pulmonary fibrosis but there is some concern for PE given his previous malignancy and also elevated d-dimer. I will empirically start him on Lovenox. Continue oxygen supplementation. Follow serial ABGs. Will get a CT chest with contrast but he needs to be premedicated given his allergy. Patient is followed by Dr. Meredeth Ide and I will consult him for his underlying chronic lung disease.  3. History of coronary artery disease, status post coronary artery bypass graft. Place him on telemetry. Follow serial cardiac markers. Continue aspirin, Plavix, beta blocker, statin and Ranexa and nitroglycerin and get a cardiology consult with Dr. Lady Gary.  4. Gastroesophageal reflux disease. Continue omeprazole. 5. Hyperlipidemia. Continue statin, gemfibrozil.  6. History of lymphoma. Followed by Dr. Sherrlyn Hock. Patient's cancer is currently in remission. He had a recent CT neck, chest, abdomen and pelvis in April of this past year which was essentially normal showing  no lymphadenopathy.  7. Hypertension. Continue metoprolol, Norvasc and ramipril. Presently hemodynamically stable.  8. Anxiety. Continue p.r.n. Xanax.  9. CODE STATUS: Patient is a FULL CODE. He will be transferred over to Dr. Wynn Bankerhaplain's service.   Critical Care TIME  SPENT: 50 minutes.   ____________________________ Rolly PancakeVivek J. Cherlynn KaiserSainani, MD vjs:cms D: 07/05/2011 09:08:23 ET T: 07/05/2011 09:25:21 ET  JOB#: [id number] cc: Rolly PancakeVivek J. Cherlynn KaiserSainani, MD, <Dictator> Jimmie Mollyon C. Candelaria Stagershaplin, MD Houston SirenVIVEK J SAINANI MD ELECTRONICALLY SIGNED 07/08/2011 16:16

## 2014-06-01 NOTE — Discharge Summary (Signed)
PATIENT NAME:  Ivan Mason, Ivan Mason MR#:  [id number] DATE OF BIRTH:  12-24-1940  DATE OF ADMISSION:  07/05/2011 DATE OF DISCHARGE:  07/07/2011  HISTORY/HOSPITAL COURSE: Ivan Mason is a 74 year old man with previous coronary artery disease with bypass who had been diagnosed with lymphoma in September 2012 and received radiation therapy. In recent months he has been undergoing active treatment for an aggressive interstitial pulmonary fibrosis syndrome. Recently he had a taper of his steroids by his pulmonologist, triggering increasing in chest pain and hypoxia for which he was seen in the Emergency Room and admitted with acute respiratory failure 48 hours ago. He has responded with some improvement in both symptoms with the use of IV steroids.   His family and the patient has requesting he be transferred immediately to [facility] where he has received multiple cares in the past. In the hospital, he has had evaluation for possible pulmonary embolus because he had a D-dimer elevation at the time of admission but did have a CAT scan after being prepped for possible allergic reaction to the dye history in the past that showed no evidence of pulmonary artery embolus disease. The findings were consistent with end-stage pulmonary fibrosis with an underlying component of infectious interstitial pneumonitis as a possibility, even the possibility of some congestive failure. The patient did receive some IV diuretics but mainly high-dose steroids with improvement in his symptom complex. He was also seen in consultation by his cardiac specialist, Dr. Serafina Royals, who felt that his cardiovascular situation was stable. He did not think he had a significant evidence of congestive failure. He was seen by his pulmonologist who had been actively involved in his care, Dr. Wallene Huh. A chest x-ray this morning showed bilateral diffuse interstitial thickening with no significant change from admission.   At the time of admission, his  B-type natriuretic hormone was 837, glucose was 126, BUN 13, sodium 134, potassium 3.8. EGFR was greater than 60. D-dimer was quite elevated at 3.35. Total protein was 6.5, albumin 2.8, bilirubin 1.3, alkaline phosphatase 73, SGOT 23, SGPT 10. CPKs, isoenzymes x3 were negative, within the normal range. Troponins were negative as well. White count was 7700, hemoglobin was 10.4. His urinalysis was unremarkable. Blood gases at the time of admission after he had been started on oxygen were pH 7.45, pCO2 37, pO2 was up at 115. He was on a nasal cannula and eventually went to nonrebreather and eventually has been weaned at this time down to a high-flow nasal cannula at 50% with a stat to 97%. He had a B12 level at 974. Sedimentation rate was 140 and iron binding capacity 191, unbound iron binding capacity 164, and saturation of 14%, serum iron of 27. A follow-up B-type natriuretic hormone on the 29th of May was 1040. Laboratory data as of May 30th: Sugar was 159, BUN 14, creatinine 0.6, sodium 138, potassium 4.3, chloride 102. White count was 11,400, hemoglobin stable at 10, normal platelet counts.   Blood pressures remained in the systolics 51 to 38. Pulses remained in the 80s and 70 range. He has been afebrile.   He has had no cultures done as far as I can tell from the dictation from the admitting doctors or the caring doctors at this time.   CURRENT MEDICATIONS:  1. Tylenol 650 as needed for temperature.  2. Alprazolam 0.25 mg b.i.d. for anxiety.  3. Amlodipine 5 mg for hypertension. 4. Ascorbic acid 1000 mg daily. 5. Aspirin 81 mg daily.  6. Atorvastatin 40 mg  daily.  7. Plavix 75 mg daily.  8. Benadryl 50 mg was given prior to his IV use for his CAT scan of the lungs. 9. Lovenox 75 mg every 12 hours. 10. Gemfibrozil 60 mg b.i.d.  11. Methylprednisone 40 mg every 6 hours.  12. Lopressor 50 mg every 12 hours. 13. Nitroglycerin 0.4 as needed.  14. Prilosec 20 mg daily.  15. Zofran 4 mg IV every 4  hours p.r.n. for nausea and vomiting, which he has not had to use. 16. Protonix 40 mg daily.  17. Salagen 5 mg daily.  18. Altace (ramipril) 2.5 mg daily.  19. Zantac 150 mg daily. 20. Vitamin E 800 mg daily.  21. He has received IV azithromycin 500 mg every 24 hours, Rocephin 1 gram IV every 24 hours. Tussionex for cough suppressant 5 mL every 12 hours.  22. Chlorpheniramine hydrocodone suspension.  23. Colace 100 mg b.i.d.  24. Milk of magnesia p.r.n. for use.  25. Zosyn 3.375 mg every 8 hours.  26. Vancomycin 750 mg IV every 12 hours.   DISCHARGE DIAGNOSES:  1. 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 2. Ischemic cardiomyopathy, stable.  3. Hypertensive cardiovascular disease, stable.  4. Hyperlipidemia, stable.  5. Anemia, etiology unclear, possibly related to his disease state.   OVERALL PROGNOSIS: Guarded.   The patient is being transferred at the patient's request for further evaluation and stabilization by the [facility].   ____________________________ Dianah Field. Mable Fill, MD dcc:cbb D: 07/07/2011 13:31:05 ET T: 07/07/2011 13:56:08 ET JOB#: [id number]  cc: Ivan Mason C. Mable Fill, MD, <Dictator> Tawni Millers MD ELECTRONICALLY SIGNED 07/07/2011 17:37

## 2014-06-01 NOTE — Consult Note (Signed)
PATIENT NAME:  Ivan Mason, DORTON MR#:  [id number] DATE OF BIRTH:  [birth year]  DATE OF CONSULTATION:  07/05/2011  REFERRING PHYSICIAN:   CONSULTING PHYSICIAN:  Lamar Blinks, MD  PRIMARY CARE PHYSICIANS: Conchita Paris, MD / Ned Clines, MD  CARDIOLOGIST: Harold Hedge, MD  REASON FOR CONSULTATION: Chest pain with known cardiovascular disease status post previous stent.   CHIEF COMPLAINT: "I have chest pain."   HISTORY OF PRESENT ILLNESS: This is a 74 year old male with known cardiovascular disease status post previous PCI and stent placement and severe lung disease with fibrosis previously stabler several weeks prior with hypertension and hyperlipidemia who has had new onset of shortness of breath and significant substernal chest discomfort radiating into his back associated with his shortness of breath. The patient did take multiple nitroglycerin pills with no relief whatsoever. He had full relief spontaneously and has now had no further significant symptoms. His EKG has shown normal sinus rhythm without evidence of acute myocardial infarction and his troponin and CK-MB are within normal limits. Upon arrival in the emergency room, the patient had significant decrease in oxygenation suggesting that hypoxia was a primary issue.   REVIEW OF SYSTEMS: The remainder of review of systems is negative for vision change, ringing in the ears, hearing loss, cough, congestion, heartburn, nausea, vomiting, diarrhea, bloody stools, stomach pain, extremity pain, leg weakness, cramping of the buttocks, known blood clots, headaches, blackouts, dizzy spells, nosebleeds, congestion, trouble swallowing, frequent urination, urination at night, muscle weakness, numbness, anxiety, depression, skin lesions, and rashes.   PAST MEDICAL HISTORY:  1. Known coronary disease status post PCI and stent placement.  2. Hypertension.  3. Hyperlipidemia.  4. Significant pulmonary fibrosis and/or lung disease.   FAMILY HISTORY:  The patient has some minimal hypertension in the family, but no evidence of early onset of cardiovascular disease.   SOCIAL HISTORY: Currently denies alcohol or tobacco use.   ALLERGIES: No known drug allergies.  CURRENT MEDICATIONS: As listed.   PHYSICAL EXAMINATION:   VITAL SIGNS: Blood pressure 126/68 bilaterally and heart rate 72 upright and reclining and regular.   GENERAL: He is a well appearing male in no acute distress.   HEENT: No icterus, thyromegaly, ulcers, hemorrhage, or xanthelasma.   HEART: Regular rate and rhythm with normal S1 and S2. Point of maximal impulse is normal size and placement. Carotid upstroke normal without bruit. Jugular venous pressure is normal.   LUNGS: Lungs have bibasilar crackles and expiratory wheezes with decreased breath sounds.   ABDOMEN: Soft and nontender without hepatosplenomegaly or masses. Abdominal aorta is normal size without bruit.   EXTREMITIES: 2+ bilateral pulses in dorsal, pedal, radial, and femoral arteries without lower extremity edema, cyanosis, clubbing, or ulcers.   NEUROLOGIC: He is oriented to time, place, and person with normal mood and affect.   ASSESSMENT: This is a 74 year old male with known coronary disease status post PCI and stent placement and coronary artery bypass surgery with severe lung disease with what appears to be exacerbation of chronic obstructive pulmonary disease and fibrosis and no current evidence of acute myocardial infarction or true congestive heart failure.     RECOMMENDATIONS:  1. Continue serial ECG and enzymes to assess for possible myocardial infarction.  2. Call for recurrent chest discomfort.  3. No further cardiac diagnostics necessary at this time due to no evidence of myocardial infarction at this time. 4. Further investigate possibility of concerns of pulmonary fibrosis or worsening lung disease and antibiotics, and/or inhalers, or steroids as necessary.  5. Begin ambulation and follow  for any further significant symptoms.  6. No change in current medical regimen for hypertension, heart rate control, and previous coronary artery disease. ____________________________ Lamar BlinksBruce J. Taisha Pennebaker, MD bjk:slb D: 07/05/2011 13:44:04 ET T: 07/05/2011 14:27:26 ET JOB#: [id number]  cc: Lamar BlinksBruce J. Josafat Enrico, MD, <Dictator> Lamar BlinksBRUCE J Preesha Benjamin MD ELECTRONICALLY SIGNED 07/11/2011 7:29
